# Patient Record
Sex: Male | Born: 1995 | Race: White | Hispanic: No | Marital: Single | Smoking: Never smoker
Health system: Southern US, Community
[De-identification: ages and names within clinical notes are randomized; demographics above are authoritative.]

## PROBLEM LIST (undated history)

## (undated) HISTORY — PX: WISDOM TOOTH EXTRACTION: SHX21

---
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
## 2021-05-20 ENCOUNTER — Ambulatory Visit (INDEPENDENT_AMBULATORY_CARE_PROVIDER_SITE_OTHER): Payer: BC Managed Care – PPO | Admitting: Family Medicine

## 2021-05-20 ENCOUNTER — Other Ambulatory Visit (HOSPITAL_COMMUNITY)
Admission: RE | Admit: 2021-05-20 | Discharge: 2021-05-20 | Disposition: A | Discharge status: Home / Self Care | Payer: BC Managed Care – PPO | Source: Ambulatory Visit | Attending: Family Medicine | Admitting: Family Medicine

## 2021-05-20 ENCOUNTER — Encounter: Payer: Self-pay | Admitting: Family Medicine

## 2021-05-20 VITALS — BP 131/79 | HR 71 | Temp 97.4°F | Ht 68.02 in | Wt 258.4 lb

## 2021-05-20 DIAGNOSIS — Z113 Encounter for screening for infections with a predominantly sexual mode of transmission: Secondary | ICD-10-CM | POA: Insufficient documentation

## 2021-05-20 DIAGNOSIS — L659 Nonscarring hair loss, unspecified: Secondary | ICD-10-CM

## 2021-05-20 DIAGNOSIS — R6882 Decreased libido: Secondary | ICD-10-CM

## 2021-05-20 DIAGNOSIS — Z6839 Body mass index (BMI) 39.0-39.9, adult: Secondary | ICD-10-CM | POA: Diagnosis not present

## 2021-05-20 DIAGNOSIS — Z7689 Persons encountering health services in other specified circumstances: Secondary | ICD-10-CM | POA: Diagnosis not present

## 2021-05-20 DIAGNOSIS — R5381 Other malaise: Secondary | ICD-10-CM | POA: Diagnosis not present

## 2021-05-20 DIAGNOSIS — Z23 Encounter for immunization: Secondary | ICD-10-CM | POA: Diagnosis not present

## 2021-05-20 DIAGNOSIS — R5383 Other fatigue: Secondary | ICD-10-CM

## 2021-05-20 NOTE — Progress Notes (Signed)
?  ? ?Subjective:  ?Patient ID: Stephen Ibarra, male    DOB: 09-05-1995, 26 y.o.   MRN: [id number] ? ?Patient Care Team: ?Baruch Gouty, FNP as PCP - General (Family Medicine)  ? ?Chief Complaint:  New Patient (Initial Visit) (Previous [facility] patient ), Establish Care, Weight Loss, and Alopecia (X 4-5 months ) ? ? ?HPI: ?Stephen Ibarra is a 26 y.o. male presenting on 05/20/2021 for New Patient (Initial Visit) (Previous [facility] patient ), Establish Care, Weight Loss, and Alopecia (X 4-5 months ) ? ? ?Pt presents today to establish care with PCP as he has not been seen in over 3 years. He has multiple complaints today including weight gain, fatigue, malaise, decreased libido, and hair loss. States he has been trying to drop weight for a few months and has not been able to lose as much weight as he would like. States he has ongoing fatigue and malaise with lack of interest in sex for over 6 months. States he feels rundown all of the time. He denies prior history of this. He also reports significant hair loss since having COVID. States hair has thinned greatly, no bald spots or scalp irritation. He is sexually active but denies any STI symptoms ? ? ? ?Relevant past medical, surgical, family, and social history reviewed and updated as indicated.  ?Allergies and medications reviewed and updated. Data reviewed: Chart in Epic. ? ? ?History reviewed. No pertinent past medical history. ? ?Past Surgical History:  ?Procedure Laterality Date  ? WISDOM TOOTH EXTRACTION    ? ? ?Social History  ? ?Socioeconomic History  ? Marital status: Single  ?  Spouse name: Not on file  ? Number of children: Not on file  ? Years of education: Not on file  ? Highest education level: Not on file  ?Occupational History  ? Not on file  ?Tobacco Use  ? Smoking status: Never  ? Smokeless tobacco: Never  ?Vaping Use  ? Vaping Use: Never used  ?Substance and Sexual Activity  ? Alcohol use: Not Currently  ? Drug use: No  ? Sexual activity: Not on file  ?Other  Topics Concern  ? Not on file  ?Social History Narrative  ? Not on file  ? ?Social Determinants of Health  ? ?Financial Resource Strain: Not on file  ?Food Insecurity: Not on file  ?Transportation Needs: Not on file  ?Physical Activity: Not on file  ?Stress: Not on file  ?Social Connections: Not on file  ?Intimate Partner Violence: Not on file  ? ? ?No outpatient encounter medications on file as of 05/20/2021.  ? ?No facility-administered encounter medications on file as of 05/20/2021.  ? ? ?No Known Allergies ? ?Review of Systems  ?Constitutional:  Positive for activity change and fatigue. Negative for appetite change, chills, diaphoresis, fever and unexpected weight change.  ?HENT: Negative.    ?Eyes: Negative.   ?Respiratory:  Negative for cough, chest tightness and shortness of breath.   ?Cardiovascular:  Negative for chest pain, palpitations and leg swelling.  ?Gastrointestinal:  Negative for abdominal pain, blood in stool, constipation, diarrhea, nausea and vomiting.  ?Endocrine: Negative.   ?Genitourinary:  Negative for decreased urine volume, difficulty urinating, dysuria, enuresis, flank pain, frequency, genital sores, hematuria, penile discharge, penile pain, penile swelling, scrotal swelling, testicular pain and urgency.  ?     Decreased libido  ?Musculoskeletal:  Negative for arthralgias and myalgias.  ?Skin:  Negative for color change, pallor, rash and wound.  ?     Hair  loss  ?Allergic/Immunologic: Negative.   ?Neurological:  Negative for dizziness, tremors, seizures, syncope, facial asymmetry, speech difficulty, weakness, light-headedness, numbness and headaches.  ?Hematological: Negative.   ?Psychiatric/Behavioral:  Negative for confusion, hallucinations, sleep disturbance and suicidal ideas.   ?All other systems reviewed and are negative. ? ?   ? ?Objective:  ?BP 131/79   Pulse 71   Temp (!) 97.4 ?F (36.3 ?C) (Temporal)   Ht 5' 8.02" (1.728 m)   Wt 258 lb 6.4 oz (117.2 kg)   SpO2 100%   BMI  39.27 kg/m?   ? ?Wt Readings from Last 3 Encounters:  ?05/20/21 258 lb 6.4 oz (117.2 kg)  ?07/17/16 186 lb (84.4 kg) (87 %, Z= 1.10)*  ?05/22/16 188 lb (85.3 kg) (88 %, Z= 1.17)*  ? ?* Growth percentiles are based on CDC (Boys, 2-20 Years) data.  ? ? ?Physical Exam ?Vitals and nursing note reviewed.  ?Constitutional:   ?   General: He is not in acute distress. ?   Appearance: Normal appearance. He is well-developed and well-groomed. He is obese. He is not ill-appearing, toxic-appearing or diaphoretic.  ?HENT:  ?   Head: Normocephalic and atraumatic.  ?   Jaw: There is normal jaw occlusion.  ?   Right Ear: Hearing, tympanic membrane, ear canal and external ear normal.  ?   Left Ear: Hearing, tympanic membrane, ear canal and external ear normal.  ?   Nose: Nose normal.  ?   Mouth/Throat:  ?   Lips: Pink.  ?   Mouth: Mucous membranes are moist.  ?   Pharynx: Oropharynx is clear. Uvula midline.  ?Eyes:  ?   General: Lids are normal.  ?   Extraocular Movements: Extraocular movements intact.  ?   Conjunctiva/sclera: Conjunctivae normal.  ?   Pupils: Pupils are equal, round, and reactive to light.  ?Neck:  ?   Thyroid: No thyroid mass, thyromegaly or thyroid tenderness.  ?   Vascular: No carotid bruit or JVD.  ?   Trachea: Trachea and phonation normal.  ?Cardiovascular:  ?   Rate and Rhythm: Normal rate and regular rhythm.  ?   Chest Wall: PMI is not displaced.  ?   Pulses: Normal pulses.  ?   Heart sounds: Normal heart sounds. No murmur heard. ?  No friction rub. No gallop.  ?Pulmonary:  ?   Effort: Pulmonary effort is normal. No respiratory distress.  ?   Breath sounds: Normal breath sounds. No wheezing.  ?Abdominal:  ?   General: Bowel sounds are normal. There is no distension or abdominal bruit.  ?   Palpations: Abdomen is soft. There is no hepatomegaly or splenomegaly.  ?   Tenderness: There is no abdominal tenderness. There is no right CVA tenderness or left CVA tenderness.  ?   Hernia: No hernia is present.   ?Musculoskeletal:     ?   General: Normal range of motion.  ?   Cervical back: Normal range of motion and neck supple.  ?   Right lower leg: No edema.  ?   Left lower leg: No edema.  ?Lymphadenopathy:  ?   Cervical: No cervical adenopathy.  ?Skin: ?   General: Skin is warm and dry.  ?   Capillary Refill: Capillary refill takes less than 2 seconds.  ?   Coloration: Skin is not cyanotic, jaundiced or pale.  ?   Findings: No rash.  ?Neurological:  ?   General: No focal deficit present.  ?  Mental Status: He is alert and oriented to person, place, and time.  ?   Cranial Nerves: No cranial nerve deficit.  ?   Sensory: Sensation is intact. No sensory deficit.  ?   Motor: Motor function is intact. No weakness.  ?   Coordination: Coordination is intact. Coordination normal.  ?   Gait: Gait is intact. Gait normal.  ?   Deep Tendon Reflexes: Reflexes are normal and symmetric. Reflexes normal.  ?Psychiatric:     ?   Attention and Perception: Attention and perception normal.     ?   Mood and Affect: Mood and affect normal.     ?   Speech: Speech normal.     ?   Behavior: Behavior normal. Behavior is cooperative.     ?   Thought Content: Thought content normal.     ?   Cognition and Memory: Cognition and memory normal.     ?   Judgment: Judgment normal.  ? ? ?Results for orders placed or performed in visit on 05/22/16  ?Rapid strep screen (not at [facility])  ? Specimen: Other  ? OTHER  ?Result Value Ref Range  ? Strep Gp A Ag, IA W/Reflex Positive (A) Negative  ? ?   ? ?Pertinent labs & imaging results that were available during my care of the patient were reviewed by me and considered in my medical decision making. ? ?Assessment & Plan:  ?Mounir was seen today for new patient (initial visit), establish care, weight loss and alopecia. ? ?Diagnoses and all orders for this visit: ? ?Encounter to establish care ?Will obtain baseline labs with additional labs due to complaints. Health maintenance discussed in detail.  ?-      CMP14+EGFR ?-     Lipid panel ?-     HIV Antibody (routine testing w rflx) ?-     Hepatitis C antibody ?-     Thyroid Panel With TSH ?-     Testosterone,Free and Total ?-     VITAMIN D 25 Hydroxy (Vit-D Deficiency, Fractures) ?-

## 2021-05-21 LAB — IRON,TIBC AND FERRITIN PANEL
Ferritin: 120 ng/mL (ref 30–400)
Iron Saturation: 24 % (ref 15–55)
Iron: 80 ug/dL (ref 38–169)
Total Iron Binding Capacity: 338 ug/dL (ref 250–450)
UIBC: 258 ug/dL (ref 111–343)

## 2021-05-21 LAB — CMP14+EGFR
ALT: 22 IU/L (ref 0–44)
AST: 19 IU/L (ref 0–40)
Albumin/Globulin Ratio: 2.3 — ABNORMAL HIGH (ref 1.2–2.2)
Albumin: 5.2 g/dL (ref 4.1–5.2)
Alkaline Phosphatase: 100 IU/L (ref 44–121)
BUN/Creatinine Ratio: 15 (ref 9–20)
BUN: 12 mg/dL (ref 6–20)
Bilirubin Total: 0.5 mg/dL (ref 0.0–1.2)
CO2: 22 mmol/L (ref 20–29)
Calcium: 10 mg/dL (ref 8.7–10.2)
Chloride: 103 mmol/L (ref 96–106)
Creatinine, Ser: 0.81 mg/dL (ref 0.76–1.27)
Globulin, Total: 2.3 g/dL (ref 1.5–4.5)
Glucose: 86 mg/dL (ref 70–99)
Potassium: 4.2 mmol/L (ref 3.5–5.2)
Sodium: 142 mmol/L (ref 134–144)
Total Protein: 7.5 g/dL (ref 6.0–8.5)
eGFR: 126 mL/min/{1.73_m2} (ref >59)

## 2021-05-21 LAB — CBC WITH DIFFERENTIAL/PLATELET
Basophils Absolute: 0.1 10*3/uL (ref 0.0–0.2)
Basos: 1 %
EOS (ABSOLUTE): 0.3 10*3/uL (ref 0.0–0.4)
Eos: 4 %
Hematocrit: 49.1 % (ref 37.5–51.0)
Hemoglobin: 16.4 g/dL (ref 13.0–17.7)
Immature Grans (Abs): 0.1 10*3/uL (ref 0.0–0.1)
Immature Granulocytes: 1 %
Lymphocytes Absolute: 2.4 10*3/uL (ref 0.7–3.1)
Lymphs: 30 %
MCH: 28.6 pg (ref 26.6–33.0)
MCHC: 33.4 g/dL (ref 31.5–35.7)
MCV: 86 fL (ref 79–97)
Monocytes Absolute: 0.7 10*3/uL (ref 0.1–0.9)
Monocytes: 8 %
Neutrophils Absolute: 4.6 10*3/uL (ref 1.4–7.0)
Neutrophils: 56 %
Platelets: 279 10*3/uL (ref 150–450)
RBC: 5.74 x10E6/uL (ref 4.14–5.80)
RDW: 13.3 % (ref 11.6–15.4)
WBC: 8.2 10*3/uL (ref 3.4–10.8)

## 2021-05-21 LAB — LIPID PANEL
Chol/HDL Ratio: 4.2 ratio (ref 0.0–5.0)
Cholesterol, Total: 167 mg/dL (ref 100–199)
HDL: 40 mg/dL (ref >39)
LDL Chol Calc (NIH): 105 mg/dL — ABNORMAL HIGH (ref 0–99)
Triglycerides: 120 mg/dL (ref 0–149)
VLDL Cholesterol Cal: 22 mg/dL (ref 5–40)

## 2021-05-21 LAB — URINE CYTOLOGY ANCILLARY ONLY
Bacterial Vaginitis-Urine: NEGATIVE
Chlamydia: NEGATIVE
Comment: NEGATIVE
Comment: NEGATIVE
Comment: NORMAL
Neisseria Gonorrhea: NEGATIVE
Trichomonas: NEGATIVE

## 2021-05-21 LAB — THYROID PANEL WITH TSH
Free Thyroxine Index: 2.5 (ref 1.2–4.9)
T3 Uptake Ratio: 27 % (ref 24–39)
T4, Total: 9.3 ug/dL (ref 4.5–12.0)
TSH: 3.15 u[IU]/mL (ref 0.450–4.500)

## 2021-05-21 LAB — TESTOSTERONE,FREE AND TOTAL
Testosterone, Free: 20.8 pg/mL (ref 9.3–26.5)
Testosterone: 366 ng/dL (ref 264–916)

## 2021-05-21 LAB — VITAMIN D 25 HYDROXY (VIT D DEFICIENCY, FRACTURES): Vit D, 25-Hydroxy: 34.1 ng/mL (ref 30.0–100.0)

## 2021-05-21 LAB — HEPATITIS C ANTIBODY: Hep C Virus Ab: NONREACTIVE

## 2021-05-21 LAB — HIV ANTIBODY (ROUTINE TESTING W REFLEX): HIV Screen 4th Generation wRfx: NONREACTIVE

## 2021-05-21 LAB — VITAMIN B12: Vitamin B-12: 438 pg/mL (ref 232–1245)

## 2021-07-18 ENCOUNTER — Encounter: Payer: Self-pay | Admitting: Family Medicine

## 2021-07-18 ENCOUNTER — Ambulatory Visit (INDEPENDENT_AMBULATORY_CARE_PROVIDER_SITE_OTHER): Payer: BC Managed Care – PPO | Admitting: Family Medicine

## 2021-07-18 VITALS — BP 125/87 | HR 78 | Temp 98.8°F | Ht 68.0 in | Wt 249.0 lb

## 2021-07-18 DIAGNOSIS — L659 Nonscarring hair loss, unspecified: Secondary | ICD-10-CM | POA: Diagnosis not present

## 2021-07-18 DIAGNOSIS — H66003 Acute suppurative otitis media without spontaneous rupture of ear drum, bilateral: Secondary | ICD-10-CM | POA: Diagnosis not present

## 2021-07-18 MED ORDER — AMOXICILLIN-POT CLAVULANATE 875-125 MG PO TABS: 1.0000 | ORAL_TABLET | Freq: Two times a day (BID) | ORAL | 0 refills | Status: AC

## 2021-07-18 NOTE — Progress Notes (Signed)
Subjective:  Patient ID: Stephen Ibarra, male    DOB: 31-Oct-1995, 26 y.o.   MRN: [id number]  Patient Care Team: Baruch Gouty, FNP as PCP - General (Family Medicine)   Chief Complaint:  Sydnee Cabal   HPI: Stephen Ibarra is a 26 y.o. male presenting on 07/18/2021 for Otalgia   Otalgia  There is pain in both ears. This is a new problem. The current episode started in the past 7 days. The problem occurs constantly. The problem has been gradually worsening. The pain is moderate. Associated symptoms include headaches, hearing loss and rhinorrhea. Pertinent negatives include no abdominal pain, coughing, diarrhea, ear discharge, neck pain, rash, sore throat or vomiting. He has tried acetaminophen (Flonase) for the symptoms. The treatment provided no relief.  Pt would also like a referral to see dermatology for his hair loss.   Relevant past medical, surgical, family, and social history reviewed and updated as indicated.  Allergies and medications reviewed and updated. Data reviewed: Chart in Epic.   History reviewed. No pertinent past medical history.  Past Surgical History:  Procedure Laterality Date   WISDOM TOOTH EXTRACTION      Social History   Socioeconomic History   Marital status: Single    Spouse name: Not on file   Number of children: Not on file   Years of education: Not on file   Highest education level: Not on file  Occupational History   Not on file  Tobacco Use   Smoking status: Never   Smokeless tobacco: Never  Vaping Use   Vaping Use: Never used  Substance and Sexual Activity   Alcohol use: Not Currently   Drug use: No   Sexual activity: Not on file  Other Topics Concern   Not on file  Social History Narrative   Not on file   Social Determinants of Health   Financial Resource Strain: Not on file  Food Insecurity: Not on file  Transportation Needs: Not on file  Physical Activity: Not on file  Stress: Not on file  Social Connections: Not on file   Intimate Partner Violence: Not on file    Outpatient Encounter Medications as of 07/18/2021  Medication Sig   amoxicillin-clavulanate (AUGMENTIN) 875-125 MG tablet Take 1 tablet by mouth 2 (two) times daily for 10 days.   No facility-administered encounter medications on file as of 07/18/2021.    No Known Allergies  Review of Systems  Constitutional:  Positive for activity change, chills, fatigue and fever. Negative for appetite change, diaphoresis and unexpected weight change.  HENT:  Positive for congestion, ear pain, hearing loss, postnasal drip and rhinorrhea. Negative for dental problem, drooling, ear discharge, facial swelling, mouth sores, nosebleeds, sinus pressure, sinus pain, sneezing, sore throat, tinnitus, trouble swallowing and voice change.   Respiratory:  Negative for cough and shortness of breath.   Gastrointestinal:  Negative for abdominal pain, diarrhea and vomiting.  Genitourinary:  Negative for decreased urine volume and difficulty urinating.  Musculoskeletal:  Negative for arthralgias, myalgias and neck pain.  Skin:  Negative for rash.  Neurological:  Positive for headaches. Negative for dizziness, tremors, seizures, syncope, facial asymmetry, speech difficulty, weakness, light-headedness and numbness.  Psychiatric/Behavioral:  Negative for confusion.   All other systems reviewed and are negative.       Objective:  BP 125/87   Pulse 78   Temp 98.8 F (37.1 C)   Ht 5' 8"  (1.727 m)   Wt 249 lb (112.9 kg)   SpO2 100%  BMI 37.86 kg/m    Wt Readings from Last 3 Encounters:  07/18/21 249 lb (112.9 kg)  05/20/21 258 lb 6.4 oz (117.2 kg)  07/17/16 186 lb (84.4 kg) (87 %, Z= 1.10)*   * Growth percentiles are based on CDC (Boys, 2-20 Years) data.    Physical Exam Vitals and nursing note reviewed.  Constitutional:      General: He is not in acute distress.    Appearance: Normal appearance. He is well-developed and well-groomed. He is obese. He is not  ill-appearing, toxic-appearing or diaphoretic.  HENT:     Head: Normocephalic and atraumatic.     Jaw: There is normal jaw occlusion.     Right Ear: Hearing, ear canal and external ear normal. Tympanic membrane is erythematous and bulging. Tympanic membrane is not perforated.     Left Ear: Hearing, ear canal and external ear normal. Tympanic membrane is erythematous and bulging. Tympanic membrane is not perforated.     Nose: Congestion and rhinorrhea present.     Right Sinus: No maxillary sinus tenderness or frontal sinus tenderness.     Left Sinus: No maxillary sinus tenderness or frontal sinus tenderness.     Mouth/Throat:     Lips: Pink.     Mouth: Mucous membranes are moist.     Pharynx: Oropharynx is clear. Uvula midline. Posterior oropharyngeal erythema present. No oropharyngeal exudate.     Tonsils: No tonsillar exudate or tonsillar abscesses.  Eyes:     General: Lids are normal.     Extraocular Movements: Extraocular movements intact.     Conjunctiva/sclera: Conjunctivae normal.     Pupils: Pupils are equal, round, and reactive to light.  Neck:     Thyroid: No thyroid mass, thyromegaly or thyroid tenderness.     Vascular: No carotid bruit or JVD.     Trachea: Trachea and phonation normal.  Cardiovascular:     Rate and Rhythm: Normal rate and regular rhythm.     Chest Wall: PMI is not displaced.     Pulses: Normal pulses.     Heart sounds: Normal heart sounds. No murmur heard.    No friction rub. No gallop.  Pulmonary:     Effort: Pulmonary effort is normal. No respiratory distress.     Breath sounds: Normal breath sounds. No wheezing.  Abdominal:     General: Bowel sounds are normal. There is no distension or abdominal bruit.     Palpations: Abdomen is soft. There is no hepatomegaly or splenomegaly.     Tenderness: There is no abdominal tenderness. There is no right CVA tenderness or left CVA tenderness.     Hernia: No hernia is present.  Musculoskeletal:        General:  Normal range of motion.     Cervical back: Normal range of motion and neck supple.     Right lower leg: No edema.     Left lower leg: No edema.  Lymphadenopathy:     Cervical: No cervical adenopathy.  Skin:    General: Skin is warm and dry.     Capillary Refill: Capillary refill takes less than 2 seconds.     Coloration: Skin is not cyanotic, jaundiced or pale.     Findings: No rash.     Comments: Hair loss   Neurological:     General: No focal deficit present.     Mental Status: He is alert and oriented to person, place, and time.     Sensory: Sensation is intact.  Motor: Motor function is intact.     Coordination: Coordination is intact.     Gait: Gait is intact.     Deep Tendon Reflexes: Reflexes are normal and symmetric.  Psychiatric:        Attention and Perception: Attention and perception normal.        Mood and Affect: Mood and affect normal.        Speech: Speech normal.        Behavior: Behavior normal. Behavior is cooperative.        Thought Content: Thought content normal.        Cognition and Memory: Cognition and memory normal.        Judgment: Judgment normal.     Results for orders placed or performed in visit on 05/20/21  CMP14+EGFR  Result Value Ref Range   Glucose 86 70 - 99 mg/dL   BUN 12 6 - 20 mg/dL   Creatinine, Ser 0.81 0.76 - 1.27 mg/dL   eGFR 126 >59 mL/min/1.73   BUN/Creatinine Ratio 15 9 - 20   Sodium 142 134 - 144 mmol/L   Potassium 4.2 3.5 - 5.2 mmol/L   Chloride 103 96 - 106 mmol/L   CO2 22 20 - 29 mmol/L   Calcium 10.0 8.7 - 10.2 mg/dL   Total Protein 7.5 6.0 - 8.5 g/dL   Albumin 5.2 4.1 - 5.2 g/dL   Globulin, Total 2.3 1.5 - 4.5 g/dL   Albumin/Globulin Ratio 2.3 (H) 1.2 - 2.2   Bilirubin Total 0.5 0.0 - 1.2 mg/dL   Alkaline Phosphatase 100 44 - 121 IU/L   AST 19 0 - 40 IU/L   ALT 22 0 - 44 IU/L  Lipid panel  Result Value Ref Range   Cholesterol, Total 167 100 - 199 mg/dL   Triglycerides 120 0 - 149 mg/dL   HDL 40 >39 mg/dL    VLDL Cholesterol Cal 22 5 - 40 mg/dL   LDL Chol Calc (NIH) 105 (H) 0 - 99 mg/dL   Chol/HDL Ratio 4.2 0.0 - 5.0 ratio  HIV Antibody (routine testing w rflx)  Result Value Ref Range   HIV Screen 4th Generation wRfx Non Reactive Non Reactive  Hepatitis C antibody  Result Value Ref Range   Hep C Virus Ab Non Reactive Non Reactive  Thyroid Panel With TSH  Result Value Ref Range   TSH 3.150 0.450 - 4.500 uIU/mL   T4, Total 9.3 4.5 - 12.0 ug/dL   T3 Uptake Ratio 27 24 - 39 %   Free Thyroxine Index 2.5 1.2 - 4.9  Testosterone,Free and Total  Result Value Ref Range   Testosterone 366 264 - 916 ng/dL   Testosterone, Free 20.8 9.3 - 26.5 pg/mL  VITAMIN D 25 Hydroxy (Vit-D Deficiency, Fractures)  Result Value Ref Range   Vit D, 25-Hydroxy 34.1 30.0 - 100.0 ng/mL  CBC with Differential/Platelet  Result Value Ref Range   WBC 8.2 3.4 - 10.8 x10E3/uL   RBC 5.74 4.14 - 5.80 x10E6/uL   Hemoglobin 16.4 13.0 - 17.7 g/dL   Hematocrit 49.1 37.5 - 51.0 %   MCV 86 79 - 97 fL   MCH 28.6 26.6 - 33.0 pg   MCHC 33.4 31.5 - 35.7 g/dL   RDW 13.3 11.6 - 15.4 %   Platelets 279 150 - 450 x10E3/uL   Neutrophils 56 Not Estab. %   Lymphs 30 Not Estab. %   Monocytes 8 Not Estab. %   Eos 4 Not Estab. %  Basos 1 Not Estab. %   Neutrophils Absolute 4.6 1.4 - 7.0 x10E3/uL   Lymphocytes Absolute 2.4 0.7 - 3.1 x10E3/uL   Monocytes Absolute 0.7 0.1 - 0.9 x10E3/uL   EOS (ABSOLUTE) 0.3 0.0 - 0.4 x10E3/uL   Basophils Absolute 0.1 0.0 - 0.2 x10E3/uL   Immature Granulocytes 1 Not Estab. %   Immature Grans (Abs) 0.1 0.0 - 0.1 x10E3/uL  Iron, TIBC and Ferritin Panel  Result Value Ref Range   Total Iron Binding Capacity 338 250 - 450 ug/dL   UIBC 258 111 - 343 ug/dL   Iron 80 38 - 169 ug/dL   Iron Saturation 24 15 - 55 %   Ferritin 120 30 - 400 ng/mL  Vitamin B12  Result Value Ref Range   Vitamin B-12 438 232 - 1,245 pg/mL  Urine cytology ancillary only  Result Value Ref Range   Neisseria Gonorrhea Negative     Chlamydia Negative    Trichomonas Negative    Bacterial Vaginitis-Urine Negative    Molecular Comment      This specimen does not meet the strict criteria set by the FDA. The   Molecular Comment      result interpretation should be considered in conjunction with the   Molecular Comment patient's clinical history.    Comment Normal Reference Range Trichomonas - Negative    Comment Normal Reference Ranger Chlamydia - Negative    Comment      Normal Reference Range Neisseria Gonorrhea - Negative       Pertinent labs & imaging results that were available during my care of the patient were reviewed by me and considered in my medical decision making.  Assessment & Plan:  Stephen Ibarra was seen today for otalgia.  Diagnoses and all orders for this visit:  Non-recurrent acute suppurative otitis media of both ears without spontaneous rupture of tympanic membranes Symptomatic care discussed in detail. Medications as prescribed. Report any new, worsening, or persistent symptoms.  -     amoxicillin-clavulanate (AUGMENTIN) 875-125 MG tablet; Take 1 tablet by mouth 2 (two) times daily for 10 days.  Non-scarring hair loss Referral to derm placed.  -     Ambulatory referral to Dermatology     Continue all other maintenance medications.  Follow up plan: Return if symptoms worsen or fail to improve.   Continue healthy lifestyle choices, including diet (rich in fruits, vegetables, and lean proteins, and low in salt and simple carbohydrates) and exercise (at least 30 minutes of moderate physical activity daily).  Educational handout given for AOM  The above assessment and management plan was discussed with the patient. The patient verbalized understanding of and has agreed to the management plan. Patient is aware to call the clinic if they develop any new symptoms or if symptoms persist or worsen. Patient is aware when to return to the clinic for a follow-up visit. Patient educated on when it is  appropriate to go to the emergency department.   Monia Pouch, FNP-[facility] [phone]

## 2021-08-19 ENCOUNTER — Ambulatory Visit (INDEPENDENT_AMBULATORY_CARE_PROVIDER_SITE_OTHER): Payer: BC Managed Care – PPO | Admitting: Family Medicine

## 2021-08-19 ENCOUNTER — Encounter: Payer: Self-pay | Admitting: Family Medicine

## 2021-08-19 VITALS — BP 116/69 | HR 52 | Temp 98.2°F | Ht 68.0 in | Wt 251.0 lb

## 2021-08-19 DIAGNOSIS — Z6839 Body mass index (BMI) 39.0-39.9, adult: Secondary | ICD-10-CM | POA: Diagnosis not present

## 2021-08-19 DIAGNOSIS — L659 Nonscarring hair loss, unspecified: Secondary | ICD-10-CM

## 2021-08-19 DIAGNOSIS — R5383 Other fatigue: Secondary | ICD-10-CM | POA: Diagnosis not present

## 2021-08-19 DIAGNOSIS — R5381 Other malaise: Secondary | ICD-10-CM

## 2021-08-19 NOTE — Progress Notes (Signed)
Subjective:  Patient ID: Fabrice Dyal, male    DOB: [birth year], 26 y.o.   MRN: [id number]  Patient Care Team: Baruch Gouty, FNP as PCP - General (Family Medicine)   Chief Complaint:  Medical Management of Chronic Issues and 1 month follow up (Ear infection f/u/Labs f/u)   HPI: Yassine Brunsman is a 26 y.o. male presenting on 08/19/2021 for Medical Management of Chronic Issues and 1 month follow up (Ear infection f/u/Labs f/u)   Pt presents today for follow up BMI, malaise and fatigue, hair loss, and AOM. He states his malaise and fatigue have resolved. He has been working out on a daily basis and is trying to eat healthier. Otalgia has resolved. He states he does not see dermatology for his hair loss until October.    Relevant past medical, surgical, family, and social history reviewed and updated as indicated.  Allergies and medications reviewed and updated. Data reviewed: Chart in Epic.   History reviewed. No pertinent past medical history.  Past Surgical History:  Procedure Laterality Date   WISDOM TOOTH EXTRACTION      Social History   Socioeconomic History   Marital status: Single    Spouse name: Not on file   Number of children: Not on file   Years of education: Not on file   Highest education level: Not on file  Occupational History   Not on file  Tobacco Use   Smoking status: Never   Smokeless tobacco: Never  Vaping Use   Vaping Use: Never used  Substance and Sexual Activity   Alcohol use: Not Currently   Drug use: No   Sexual activity: Not on file  Other Topics Concern   Not on file  Social History Narrative   Not on file   Social Determinants of Health   Financial Resource Strain: Not on file  Food Insecurity: Not on file  Transportation Needs: Not on file  Physical Activity: Not on file  Stress: Not on file  Social Connections: Not on file  Intimate Partner Violence: Not on file    No outpatient encounter medications on file as of  08/19/2021.   No facility-administered encounter medications on file as of 08/19/2021.    No Known Allergies  Review of Systems  Constitutional:  Negative for activity change, appetite change, chills, diaphoresis, fatigue, fever and unexpected weight change.  HENT: Negative.    Eyes: Negative.   Respiratory:  Negative for cough, chest tightness and shortness of breath.   Cardiovascular:  Negative for chest pain, palpitations and leg swelling.  Gastrointestinal:  Negative for abdominal pain, blood in stool, constipation, diarrhea, nausea and vomiting.  Endocrine: Negative.   Genitourinary:  Negative for decreased urine volume, difficulty urinating, dysuria, frequency and urgency.  Musculoskeletal:  Negative for arthralgias and myalgias.  Skin:        Hair loss  Allergic/Immunologic: Negative.   Neurological:  Negative for dizziness and headaches.  Hematological: Negative.   Psychiatric/Behavioral:  Negative for confusion, hallucinations, sleep disturbance and suicidal ideas.   All other systems reviewed and are negative.       Objective:  BP 116/69   Pulse (!) 52   Temp 98.2 F (36.8 C)   Ht _0  (1.727 m)   Wt 251 lb (113.9 kg)   SpO2 98%   BMI 38.16 kg/m    Wt Readings from Last 3 Encounters:  08/19/21 251 lb (113.9 kg)  07/18/21 249 lb (112.9 kg)  05/20/21 258 lb 6.4  oz (117.2 kg)    Physical Exam Vitals and nursing note reviewed.  Constitutional:      General: He is not in acute distress.    Appearance: Normal appearance. He is well-developed and well-groomed. He is obese. He is not ill-appearing, toxic-appearing or diaphoretic.  HENT:     Head: Normocephalic and atraumatic.     Jaw: There is normal jaw occlusion.     Right Ear: Hearing, tympanic membrane, ear canal and external ear normal.     Left Ear: Hearing, tympanic membrane, ear canal and external ear normal.     Nose: Nose normal.     Mouth/Throat:     Lips: Pink.     Mouth: Mucous membranes are  moist.     Pharynx: Oropharynx is clear. Uvula midline.  Eyes:     General: Lids are normal.     Pupils: Pupils are equal, round, and reactive to light.  Neck:     Thyroid: No thyroid mass, thyromegaly or thyroid tenderness.     Vascular: No carotid bruit or JVD.     Trachea: Trachea and phonation normal.  Cardiovascular:     Rate and Rhythm: Normal rate and regular rhythm.     Chest Wall: PMI is not displaced.     Pulses: Normal pulses.     Heart sounds: Normal heart sounds. No murmur heard.    No friction rub. No gallop.  Pulmonary:     Effort: No respiratory distress.     Breath sounds: Normal breath sounds.  Abdominal:     General: There is no abdominal bruit.     Palpations: There is no hepatomegaly or splenomegaly.  Musculoskeletal:        General: Normal range of motion.     Cervical back: Normal range of motion and neck supple.     Right lower leg: No edema.     Left lower leg: No edema.  Lymphadenopathy:     Cervical: No cervical adenopathy.  Skin:    General: Skin is warm and dry.     Capillary Refill: Capillary refill takes less than 2 seconds.     Coloration: Skin is not cyanotic, jaundiced or pale.     Findings: No rash.       Neurological:     General: No focal deficit present.     Mental Status: He is alert and oriented to person, place, and time.     Sensory: Sensation is intact.     Motor: Motor function is intact.     Coordination: Coordination is intact.     Gait: Gait is intact.     Deep Tendon Reflexes: Reflexes are normal and symmetric.  Psychiatric:        Attention and Perception: Attention and perception normal.        Mood and Affect: Mood and affect normal.        Speech: Speech normal.        Behavior: Behavior normal. Behavior is cooperative.        Thought Content: Thought content normal.        Cognition and Memory: Cognition and memory normal.        Judgment: Judgment normal.     Results for orders placed or performed in visit on  05/20/21  CMP14+EGFR  Result Value Ref Range   Glucose 86 70 - 99 mg/dL   BUN 12 6 - 20 mg/dL   Creatinine, Ser 0.81 0.76 - 1.27 mg/dL   eGFR 126 >  59 mL/min/1.73   BUN/Creatinine Ratio 15 9 - 20   Sodium 142 134 - 144 mmol/L   Potassium 4.2 3.5 - 5.2 mmol/L   Chloride 103 96 - 106 mmol/L   CO2 22 20 - 29 mmol/L   Calcium 10.0 8.7 - 10.2 mg/dL   Total Protein 7.5 6.0 - 8.5 g/dL   Albumin 5.2 4.1 - 5.2 g/dL   Globulin, Total 2.3 1.5 - 4.5 g/dL   Albumin/Globulin Ratio 2.3 (H) 1.2 - 2.2   Bilirubin Total 0.5 0.0 - 1.2 mg/dL   Alkaline Phosphatase 100 44 - 121 IU/L   AST 19 0 - 40 IU/L   ALT 22 0 - 44 IU/L  Lipid panel  Result Value Ref Range   Cholesterol, Total 167 100 - 199 mg/dL   Triglycerides 120 0 - 149 mg/dL   HDL 40 >39 mg/dL   VLDL Cholesterol Cal 22 5 - 40 mg/dL   LDL Chol Calc (NIH) 105 (H) 0 - 99 mg/dL   Chol/HDL Ratio 4.2 0.0 - 5.0 ratio  HIV Antibody (routine testing w rflx)  Result Value Ref Range   HIV Screen 4th Generation wRfx Non Reactive Non Reactive  Hepatitis C antibody  Result Value Ref Range   Hep C Virus Ab Non Reactive Non Reactive  Thyroid Panel With TSH  Result Value Ref Range   TSH 3.150 0.450 - 4.500 uIU/mL   T4, Total 9.3 4.5 - 12.0 ug/dL   T3 Uptake Ratio 27 24 - 39 %   Free Thyroxine Index 2.5 1.2 - 4.9  Testosterone,Free and Total  Result Value Ref Range   Testosterone 366 264 - 916 ng/dL   Testosterone, Free 20.8 9.3 - 26.5 pg/mL  VITAMIN D 25 Hydroxy (Vit-D Deficiency, Fractures)  Result Value Ref Range   Vit D, 25-Hydroxy 34.1 30.0 - 100.0 ng/mL  CBC with Differential/Platelet  Result Value Ref Range   WBC 8.2 3.4 - 10.8 x10E3/uL   RBC 5.74 4.14 - 5.80 x10E6/uL   Hemoglobin 16.4 13.0 - 17.7 g/dL   Hematocrit 49.1 37.5 - 51.0 %   MCV 86 79 - 97 fL   MCH 28.6 26.6 - 33.0 pg   MCHC 33.4 31.5 - 35.7 g/dL   RDW 13.3 11.6 - 15.4 %   Platelets 279 150 - 450 x10E3/uL   Neutrophils 56 Not Estab. %   Lymphs 30 Not Estab. %    Monocytes 8 Not Estab. %   Eos 4 Not Estab. %   Basos 1 Not Estab. %   Neutrophils Absolute 4.6 1.4 - 7.0 x10E3/uL   Lymphocytes Absolute 2.4 0.7 - 3.1 x10E3/uL   Monocytes Absolute 0.7 0.1 - 0.9 x10E3/uL   EOS (ABSOLUTE) 0.3 0.0 - 0.4 x10E3/uL   Basophils Absolute 0.1 0.0 - 0.2 x10E3/uL   Immature Granulocytes 1 Not Estab. %   Immature Grans (Abs) 0.1 0.0 - 0.1 x10E3/uL  Iron, TIBC and Ferritin Panel  Result Value Ref Range   Total Iron Binding Capacity 338 250 - 450 ug/dL   UIBC 258 111 - 343 ug/dL   Iron 80 38 - 169 ug/dL   Iron Saturation 24 15 - 55 %   Ferritin 120 30 - 400 ng/mL  Vitamin B12  Result Value Ref Range   Vitamin B-12 438 232 - 1,245 pg/mL  Urine cytology ancillary only  Result Value Ref Range   Neisseria Gonorrhea Negative    Chlamydia Negative    Trichomonas Negative    Bacterial Vaginitis-Urine  Negative    Molecular Comment      This specimen does not meet the strict criteria set by the FDA. The   Molecular Comment      result interpretation should be considered in conjunction with the   Molecular Comment patient's clinical history.    Comment Normal Reference Range Trichomonas - Negative    Comment Normal Reference Ranger Chlamydia - Negative    Comment      Normal Reference Range Neisseria Gonorrhea - Negative       Pertinent labs & imaging results that were available during my care of the patient were reviewed by me and considered in my medical decision making.  Assessment & Plan:  Sanjeev was seen today for medical management of chronic issues and 1 month follow up.  Diagnoses and all orders for this visit:  Non-scarring hair loss Recommended supplements such as nutrafol or hair skin and nails. Follow up with dermatology as scheduled.   BMI 39.0-39.9,adult Diet and exercise encouraged.   Malaise and fatigue Resolved.     Continue all other maintenance medications.  Follow up plan: Return in about 1 year (around 08/20/2022), or if  symptoms worsen or fail to improve, for CPE.   Continue healthy lifestyle choices, including diet (rich in fruits, vegetables, and lean proteins, and low in salt and simple carbohydrates) and exercise (at least 30 minutes of moderate physical activity daily).  Educational handout given for health maintenance, calorie counting for weight loss  The above assessment and management plan was discussed with the patient. The patient verbalized understanding of and has agreed to the management plan. Patient is aware to call the clinic if they develop any new symptoms or if symptoms persist or worsen. Patient is aware when to return to the clinic for a follow-up visit. Patient educated on when it is appropriate to go to the emergency department.   Monia Pouch, FNP-C [facility] [phone]

## 2021-08-19 NOTE — Patient Instructions (Signed)
Diet encouraged - increase intake of fresh fruits and vegetables, increase intake of lean proteins. Bake, broil, or grill foods. Avoid fried, greasy, and fatty foods. Avoid fast foods. Increase intake of fiber-rich whole grains. Exercise encouraged - at least 150 minutes per week and advance as tolerated.

## 2021-12-03 DIAGNOSIS — L649 Androgenic alopecia, unspecified: Secondary | ICD-10-CM | POA: Diagnosis not present

## 2022-01-05 DIAGNOSIS — Z20822 Contact with and (suspected) exposure to covid-19: Secondary | ICD-10-CM | POA: Diagnosis not present

## 2022-01-05 DIAGNOSIS — H6691 Otitis media, unspecified, right ear: Secondary | ICD-10-CM | POA: Diagnosis not present

## 2022-01-05 DIAGNOSIS — J029 Acute pharyngitis, unspecified: Secondary | ICD-10-CM | POA: Diagnosis not present

## 2022-07-20 DIAGNOSIS — M25562 Pain in left knee: Secondary | ICD-10-CM | POA: Diagnosis not present

## 2022-08-21 ENCOUNTER — Encounter: Payer: 59 | Admitting: Family Medicine

## 2022-09-13 DIAGNOSIS — J029 Acute pharyngitis, unspecified: Secondary | ICD-10-CM | POA: Diagnosis not present

## 2022-09-13 DIAGNOSIS — R509 Fever, unspecified: Secondary | ICD-10-CM | POA: Diagnosis not present

## 2022-09-13 DIAGNOSIS — U071 COVID-19: Secondary | ICD-10-CM | POA: Diagnosis not present

## 2022-09-13 DIAGNOSIS — R059 Cough, unspecified: Secondary | ICD-10-CM | POA: Diagnosis not present

## 2023-09-08 ENCOUNTER — Ambulatory Visit: Payer: Self-pay | Admitting: Dermatology

## 2023-09-09 ENCOUNTER — Ambulatory Visit (INDEPENDENT_AMBULATORY_CARE_PROVIDER_SITE_OTHER): Payer: Self-pay | Admitting: Dermatology

## 2023-09-09 ENCOUNTER — Encounter: Payer: Self-pay | Admitting: Dermatology

## 2023-09-09 VITALS — BP 125/79 | HR 89

## 2023-09-09 DIAGNOSIS — L649 Androgenic alopecia, unspecified: Secondary | ICD-10-CM | POA: Diagnosis not present

## 2023-09-09 MED ORDER — MINOXIDIL 2.5 MG PO TABS: 2.5000 mg | ORAL_TABLET | Freq: Every day | ORAL | 6 refills | Status: DC

## 2023-09-09 NOTE — Progress Notes (Signed)
   New Patient Visit   Subjective  Stephen Ibarra is a 28 y.o. male who presents for the following: Hair loss  Patient states he has hair loss  located at the scalp that he  would like to have examined. Patient reports the areas have been there for 2 years. He reports the areas are not bothersome. Patient reports he  has not previously been treated for these areas. He has tried Hims shampoo that contains minoxidil . He is currently taking Nutrafol. He reports his dad has some hair thinning.  The following portions of the chart were reviewed this encounter and updated as appropriate: medications, allergies, medical history  Review of Systems:  No other skin or systemic complaints except as noted in HPI or Assessment and Plan.  Objective  Well appearing patient in no apparent distress; mood and affect are within normal limits.  A focused examination was performed of the following areas: Scalp  Relevant exam findings are noted in the Assessment and Plan.                Assessment & Plan   1. 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 Family history of hair thinning on the paternal side supports the genetic component of this condition. Androgenetic alopecia is characterized by hair follicles with hormone receptors that cause hair to shrink and stop growing when exposed to hormones. While there is no cure, treatment options are available, particularly effective when initiated in the early stages of hair thinning.  - Plan:    Initiate oral minoxidil  therapy:     - Start with 2.5 mg (half tablet) daily for the first month     - If well-tolerated, increase to 2.5 mg (whole tablet) daily for four months     - Take at bedtime if no lightheadedness or dizziness experienced     - Discontinue and contact clinic if lightheadedness or dizziness occurs    Continue using  Neutrophil (independently tested for ingredients and results)    Optional: Use Hynns shampoo if desired (not necessary with oral minoxidil )    Patient education provided on:     - Mechanism of androgenetic alopecia     - Treatment rationale and expectations     - Potential side effects of oral minoxidil  (lightheadedness, dizziness)     - Instructions for medication administration and dose escalation    Contact clinic via MyChart for any issues or concerns  Follow-up appointment in four months for progress evaluation and comparison of pictures.    ANDROGENIC ALOPECIA   Related Medications minoxidil  (LONITEN ) 2.5 MG tablet Take 1 tablet (2.5 mg total) by mouth daily. Take 1/2 tablet for the first month after the first month increase to 1 tablet  Return in about 4 months (around 01/09/2024) for Androgenetic Alopecia F/U.  I, Jetta Ager, am acting as Neurosurgeon for [employer], DO.  Documentation: I have reviewed the above documentation for accuracy and completeness, and I agree with the above.  Delon Lenis, DO

## 2023-09-09 NOTE — Patient Instructions (Addendum)
 Date: Thu Sep 09 2023  Hello Stephen Ibarra,  Thank you for visiting today. Here is a summary of the key instructions:  Diagnosis:  Androgenetic Alopecia  - Oral Minoxidil :   - Take half a 2.5 mg tablet daily for the first month   - If no side effects, take one whole 2.5 mg tablet daily after the first month   - Take at bedtime if no lightheadedness or dizziness  - Continue taking daily Nutrafol supplements  - Safety Instructions: Stop taking minoxidil  and call the office if you feel lightheaded or dizzy  - Communication: Use MyChart for fastest response if you have any issues  - Follow-up: Return for follow-up appointment in 4 months   - Bring comparison pictures to the follow-up appointment  - Prescription: Oral minoxidil  prescription will be sent to your regular pharmacy  Please reach out if you have any questions or concerns.  Warm regards,  Dr. Delon Lenis [facility]     Important Information   Due to recent changes in healthcare laws, you may see results of your pathology and/or laboratory studies on MyChart before the doctors have had a chance to review them. We understand that in some cases there may be results that are confusing or concerning to you. Please understand that not all results are received at the same time and often the doctors may need to interpret multiple results in order to provide you with the best plan of care or course of treatment. Therefore, we ask that you please give us  2 business days to thoroughly review all your results before contacting the office for clarification. Should we see a critical lab result, you will be contacted sooner.     If You Need Anything After Your Visit   If you have any questions or concerns for your doctor, please call our main line at [phone]. If no one answers, please leave a voicemail as directed and we will return your call as soon as possible. Messages left after 4 pm will be answered the following business day.     You may also send us  a message via MyChart. We typically respond to MyChart messages within 1-2 business days.  For prescription refills, please ask your pharmacy to contact our office. Our fax number is [phone].  If you have an urgent issue when the clinic is closed that cannot wait until the next business day, you can page your doctor at the number below.     Please note that while we do our best to be available for urgent issues outside of office hours, we are not available 24/7.    If you have an urgent issue and are unable to reach us , you may choose to seek medical care at your doctor's office, retail clinic, urgent care center, or emergency room.   If you have a medical emergency, please immediately call 911 or go to the emergency department. In the event of inclement weather, please call our main line at [phone] for an update on the status of any delays or closures.  Dermatology Medication Tips: Please keep the boxes that topical medications come in in order to help keep track of the instructions about where and how to use these. Pharmacies typically print the medication instructions only on the boxes and not directly on the medication tubes.   If your medication is too expensive, please contact our office at [phone] or send us  a message through MyChart.    We are unable to tell what your  co-pay for medications will be in advance as this is different depending on your insurance coverage. However, we may be able to find a substitute medication at lower cost or fill out paperwork to get insurance to cover a needed medication.    If a prior authorization is required to get your medication covered by your insurance company, please allow us  1-2 business days to complete this process.   Drug prices often vary depending on where the prescription is filled and some pharmacies may offer cheaper prices.   The website www.goodrx.com contains coupons for medications through  different pharmacies. The prices here do not account for what the cost may be with help from insurance (it may be cheaper with your insurance), but the website can give you the price if you did not use any insurance.  - You can print the associated coupon and take it with your prescription to the pharmacy.  - You may also stop by our office during regular business hours and pick up a GoodRx coupon card.  - If you need your prescription sent electronically to a different pharmacy, notify our office through [name] or by phone at [phone]

## 2024-01-18 ENCOUNTER — Ambulatory Visit (INDEPENDENT_AMBULATORY_CARE_PROVIDER_SITE_OTHER): Admitting: Dermatology

## 2024-01-18 ENCOUNTER — Encounter: Payer: Self-pay | Admitting: Dermatology

## 2024-01-18 DIAGNOSIS — L649 Androgenic alopecia, unspecified: Secondary | ICD-10-CM | POA: Diagnosis not present

## 2024-01-18 DIAGNOSIS — Z79899 Other long term (current) drug therapy: Secondary | ICD-10-CM | POA: Diagnosis not present

## 2024-01-18 DIAGNOSIS — L905 Scar conditions and fibrosis of skin: Secondary | ICD-10-CM | POA: Diagnosis not present

## 2024-01-18 MED ORDER — MINOXIDIL 2.5 MG PO TABS: 5.0000 mg | ORAL_TABLET | Freq: Every day | ORAL | 6 refills | Status: AC

## 2024-01-18 NOTE — Patient Instructions (Signed)

## 2024-01-18 NOTE — Progress Notes (Unsigned)
   Follow-Up Visit   Subjective  Stephen Ibarra is a 28 y.o. male who presents for the following: Androgenetic Alopecia   Patient present today for follow up visit for Androgenetic Alopecia. Patient was last evaluated on 09/09/2023. At this visit patient was prescribed oral Minoxidil  2.5 mg to take daily & recommended to continue Nutrafol supplements. Patient reports sxs are unchanged although there a small improvement on photot today. Patient denies medication changes.  Patient provided verbal consent for the use of an AI-assisted program to generate a detailed after-visit summary. The patient understands that the AI tool is used to support clinical documentation and that all information will be reviewed and verified by the healthcare provider.  The following portions of the chart were reviewed this encounter and updated as appropriate: medications, allergies, medical history  Review of Systems:  No other skin or systemic complaints except as noted in HPI or Assessment and Plan.  Objective  Well appearing patient in no apparent distress; mood and affect are within normal limits.  A focused examination was performed of the following areas: Scalp  Relevant exam findings are noted in the Assessment and Plan.    Assessment & Plan   ANDROGENETIC ALOPECIA (MALE PATTERN HAIR LOSS) Exam: Frontal scalp thinning with intact frontal hairline and miniaturization   Wellcontrolled vs flared vs notatgoal vs improved  Androgenetic Alopecia (or Male pattern hair loss) refers to the common patterned hair loss affecting many men.  Male pattern alopecia is mediated by dihydrotestosterone which induces miniaturization of androgen-sensitive hair follicles.  It is chronic and persistent, but treatable; not curable. Topical treatment includes: - 5% topical Minoxidil  Oral treatment includes: - Finasteride 1 mg qd - Minoxidil  1.25 - 5 mg qd - Dutasteride 0.5 mg qd Adjunct therapy includes: - Low Level  Laser Light Therapy (LLLT) - Platelet-rich Plasma injections (PRP) - Hair Transplantation or scalp reduction  Treatment Plan: - Increase Minoxidil  2.5 to 5mg  daily, for the first month take 1 1/5 tablets after 1 month if tolerated well in crease to 2 tablets daily. - Plan to follow up in 1 year  Long term medication management.  Patient is using long term (months to years) prescription medication  to control their dermatologic condition.  These medications require periodic monitoring to evaluate for efficacy and side effects and may require periodic laboratory monitoring.   SCAR Exam: Dyspigmented smooth macule or patch. Benign-appearing.  Observation.  Call clinic for new or changing lesions. Recommend daily broad spectrum sunscreen SPF 30+, reapply every 2 hours as needed. Treatment: Recommend Serica moisturizing scar formula cream every night or [facility] brand or Mederma silicone scar sheet every night for the first year after a scar appears to help with scar remodeling if desired. Scars remodel on their own for a full year and will gradually improve in appearance over time.   Return in about 8 months (around 09/17/2024) for Androgenetic Alopecia F/U.  I, Tammee Thielke, am acting as neurosurgeon for [employer], DO.  Documentation: I have reviewed the above documentation for accuracy and completeness, and I agree with the above.  Delon Lenis, DO
